# Patient Record
Sex: Male | Born: 1995 | Race: Black or African American | Hispanic: No | Marital: Single | Smoking: Former smoker
Health system: Southern US, Community
[De-identification: ages and names within clinical notes are randomized; demographics above are authoritative.]

## PROBLEM LIST (undated history)

## (undated) DIAGNOSIS — J45909 Unspecified asthma, uncomplicated: Secondary | ICD-10-CM

## (undated) DIAGNOSIS — T7840XA Allergy, unspecified, initial encounter: Secondary | ICD-10-CM

## (undated) DIAGNOSIS — L309 Dermatitis, unspecified: Secondary | ICD-10-CM

## (undated) HISTORY — DX: Allergy, unspecified, initial encounter: T78.40XA

## (undated) HISTORY — DX: Unspecified asthma, uncomplicated: J45.909

---
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
## 2016-02-20 ENCOUNTER — Encounter (HOSPITAL_COMMUNITY): Payer: Self-pay | Admitting: Emergency Medicine

## 2016-02-20 ENCOUNTER — Emergency Department (HOSPITAL_COMMUNITY)
Admission: EM | Admit: 2016-02-20 | Discharge: 2016-02-20 | Disposition: A | Discharge status: Home / Self Care | Payer: Managed Care, Other (non HMO) | Attending: Emergency Medicine | Admitting: Emergency Medicine

## 2016-02-20 DIAGNOSIS — L259 Unspecified contact dermatitis, unspecified cause: Secondary | ICD-10-CM | POA: Diagnosis not present

## 2016-02-20 DIAGNOSIS — L309 Dermatitis, unspecified: Secondary | ICD-10-CM

## 2016-02-20 MED ORDER — CETAPHIL MOISTURIZING EX CREA
TOPICAL_CREAM | Freq: Two times a day (BID) | CUTANEOUS | 0 refills | Status: DC
Start: 2016-02-20 — End: 2023-01-26

## 2016-02-20 MED ORDER — TRIAMCINOLONE ACETONIDE 0.1 % EX CREA: 1.0000 "application " | TOPICAL_CREAM | Freq: Two times a day (BID) | CUTANEOUS | 0 refills | Status: DC

## 2016-02-20 MED ORDER — PREDNISONE 10 MG (21) PO TBPK: 10.0000 mg | ORAL_TABLET | Freq: Every day | ORAL | 0 refills | Status: DC

## 2016-02-20 NOTE — ED Triage Notes (Signed)
Patient c/o eczema flaring up on face and body x 4 days.

## 2016-02-20 NOTE — Discharge Instructions (Signed)
Read the information below.  Use the prescribed medication as directed.  Please discuss all new medications with your pharmacist.  You may return to the Emergency Department at any time for worsening condition or any new symptoms that concern you.     If you develop redness, swelling, pus draining from the wound, or fevers greater than 100.4, return to the ER immediately for a recheck.   °

## 2016-02-20 NOTE — ED Notes (Signed)
Patient was alert, oriented and stable upon discharge. RN went over AVS and patient had no further questions.  

## 2016-02-20 NOTE — ED Provider Notes (Signed)
[facility]-EMERGENCY DEPT Provider Note   CSN: [id number] Arrival date & time: 02/20/16  1735  By signing my name below, I, Soijett Blue, attest that this documentation has been prepared under the direction and in the presence of Trixie DredgeEmily Jacier Gladu, PA-C Electronically Signed: Soijett Blue, ED Scribe. 02/20/16. 7:05 PM.  History   Chief Complaint Chief Complaint  Patient presents with  . Eczema    HPI Randall Price is a 20 y.o. male with a PMHx of eczema, who presents to the Emergency Department complaining of eczema flare onset 4 days ago. Pt states that this current eczema flare is localized to his trunk, face, and BUE. Pt reports that his rash is worsened when he attempts to stretch his chest and he denies alleviating factors for his rash. Pt notes that when he scratches the affected areas, he will have intermittent drainage from the affected areas. Pt states that he has an appointment with a dermatologist on 03/19/2015. He notes that he has not tried any medications for the relief of his symptoms. He denies painful areas, fever, chills, color change, and any other symptoms.     The history is provided by the patient. No language interpreter was used.    Past Medical History:  Diagnosis Date  . Eczema     There are no active problems to display for this patient.   Past Surgical History:  Procedure Laterality Date  . HIP SURGERY Right 2011       Home Medications    Prior to Admission medications   Medication Sig Start Date End Date Taking? Authorizing Provider  Emollient (CETAPHIL) cream Apply topically 2 (two) times daily. 02/20/16   Trixie DredgeEmily Avonne Berkery, PA-C  ibuprofen (ADVIL,MOTRIN) 600 MG tablet Take 1 tablet (600 mg total) by mouth every 6 (six) hours as needed. 10/18/15   Barrett HenleNicole Elizabeth Nadeau, PA-C  predniSONE (STERAPRED UNI-PAK 21 TAB) 10 MG (21) TBPK tablet Take 1 tablet (10 mg total) by mouth daily. Day 1: take 6 tabs.  Day 2: 5 tabs  Day 3: 4 tabs  Day 4: 3 tabs  Day 5: 2 tabs  Day  6: 1 tab 02/20/16   Trixie DredgeEmily Mahoganie Basher, PA-C  triamcinolone cream (KENALOG) 0.1 % Apply 1 application topically 2 (two) times daily. On small areas at a time. 02/20/16   Trixie DredgeEmily Gertrue Willette, PA-C    Family History No family history on file.  Social History Social History  Substance Use Topics  . Smoking status: Never Smoker  . Smokeless tobacco: Never Used  . Alcohol use Yes     Comment: rarely     Allergies   Patient has no known allergies.   Review of Systems Review of Systems  Constitutional: Negative for chills and fever.  Musculoskeletal: Negative for myalgias.  Skin: Positive for rash (eczema flare localized to trunk, face, and BUE). Negative for color change and wound.  Allergic/Immunologic: Negative for immunocompromised state.  Psychiatric/Behavioral: Negative for self-injury.     Physical Exam Updated Vital Signs BP 131/73   Pulse 86   Temp 97.6 F (36.4 C)   Resp 16   SpO2 100%   Physical Exam  Constitutional: He appears well-developed and well-nourished. No distress.  HENT:  Head: Normocephalic and atraumatic.  Neck: Neck supple.  Pulmonary/Chest: Effort normal.  Neurological: He is alert.  Skin: He is not diaphoretic. No erythema.  Diffuse dry thickened skin with scale and lichenification over face, trunk and BUE. No erythema, edema, warmth, discharge, or tenderness.  Nursing note and  vitals reviewed.    ED Treatments / Results  DIAGNOSTIC STUDIES: Oxygen Saturation is 100% on RA, nl by my interpretation.    COORDINATION OF CARE: 6:56 PM Discussed treatment plan with pt at bedside which includes triamcinolone Rx, prednisone Rx and pt agreed to plan.   Procedures Procedures (including critical care time)  Medications Ordered in ED Medications - No data to display   Initial Impression / Assessment and Plan / ED Course  I have reviewed the triage vital signs and the nursing notes.   Clinical Course     Afebrile, nontoxic patient with chronic eczema  with a flare up of his eczema.  No e/o superinfection.  Pt has been out of his triamcinolone cream.   D/C home with prednisone, triamcinolone cream, cetaphil cream, PCP/derm follow up.   Discussed result, findings, treatment, and follow up  with patient.  Pt given return precautions.  Pt verbalizes understanding and agrees with plan.       Final Clinical Impressions(s) / ED Diagnoses   Final diagnoses:  Eczema, unspecified type    New Prescriptions Discharge Medication List as of 02/20/2016  7:04 PM    START taking these medications   Details  Emollient (CETAPHIL) cream Apply topically 2 (two) times daily., Starting Tue 02/20/2016, Print    predniSONE (STERAPRED UNI-PAK 21 TAB) 10 MG (21) TBPK tablet Take 1 tablet (10 mg total) by mouth daily. Day 1: take 6 tabs.  Day 2: 5 tabs  Day 3: 4 tabs  Day 4: 3 tabs  Day 5: 2 tabs  Day 6: 1 tab, Starting Tue 02/20/2016, Print       I personally performed the services described in this documentation, which was scribed in my presence. The recorded information has been reviewed and is accurate.    Trixie Dredgemily Jacobi Ryant, PA-C 02/20/16 1939    Rolan BuccoMelanie Belfi, MD 02/20/16 [phone]

## 2016-04-09 ENCOUNTER — Encounter (HOSPITAL_COMMUNITY): Payer: Self-pay | Admitting: Emergency Medicine

## 2016-04-09 ENCOUNTER — Emergency Department (HOSPITAL_COMMUNITY)
Admission: EM | Admit: 2016-04-09 | Discharge: 2016-04-09 | Disposition: A | Discharge status: Home / Self Care | Payer: Managed Care, Other (non HMO) | Attending: Emergency Medicine | Admitting: Emergency Medicine

## 2016-04-09 DIAGNOSIS — L308 Other specified dermatitis: Secondary | ICD-10-CM

## 2016-04-09 DIAGNOSIS — L309 Dermatitis, unspecified: Secondary | ICD-10-CM | POA: Insufficient documentation

## 2016-04-09 MED ORDER — TRIAMCINOLONE ACETONIDE 0.1 % EX CREA: 1.0000 "application " | TOPICAL_CREAM | Freq: Two times a day (BID) | CUTANEOUS | 0 refills | Status: DC

## 2016-04-09 MED ORDER — PREDNISONE 20 MG PO TABS: ORAL_TABLET | ORAL | 0 refills | Status: DC

## 2016-04-09 NOTE — ED Provider Notes (Signed)
[facility] DEPT Provider Note   CSN: [id number] Arrival date & time: 04/09/16  1231   By signing my name below, I, Soijett Blue, attest that this documentation has been prepared under the direction and in the presence of Sharilyn SitesLisa Cathryn Gallery, PA-C Electronically Signed: Soijett Blue, ED Scribe. 04/09/16. 2:21 PM.  History   Chief Complaint Chief Complaint  Patient presents with  . Rash    HPI Randall Price is a 21 y.o. male with a PMHx of eczema, who presents to the Emergency Department complaining of exacerbation of eczema onset 2 days ago. Pt reports that the affected areas are pruritic. He hasn't tried any medications for the relief of his symptoms. Pt denies fever, chills, drainage, and any other symptoms. Denies allergies to medications. Denies having a PCP.  The history is provided by the patient. No language interpreter was used.    Past Medical History:  Diagnosis Date  . Eczema     There are no active problems to display for this patient.   Past Surgical History:  Procedure Laterality Date  . HIP SURGERY Right 2011       Home Medications    Prior to Admission medications   Medication Sig Start Date End Date Taking? Authorizing Provider  Emollient (CETAPHIL) cream Apply topically 2 (two) times daily. 02/20/16   Trixie DredgeEmily West, PA-C  ibuprofen (ADVIL,MOTRIN) 600 MG tablet Take 1 tablet (600 mg total) by mouth every 6 (six) hours as needed. 10/18/15   Barrett HenleNicole Elizabeth Nadeau, PA-C  predniSONE (STERAPRED UNI-PAK 21 TAB) 10 MG (21) TBPK tablet Take 1 tablet (10 mg total) by mouth daily. Day 1: take 6 tabs.  Day 2: 5 tabs  Day 3: 4 tabs  Day 4: 3 tabs  Day 5: 2 tabs  Day 6: 1 tab 02/20/16   Trixie DredgeEmily West, PA-C  triamcinolone cream (KENALOG) 0.1 % Apply 1 application topically 2 (two) times daily. On small areas at a time. 02/20/16   Trixie DredgeEmily West, PA-C    Family History Family History  Problem Relation Age of Onset  . Hypertension Mother     Social History Social History    Substance Use Topics  . Smoking status: Never Smoker  . Smokeless tobacco: Never Used  . Alcohol use Yes     Comment: rarely     Allergies   Patient has no known allergies.   Review of Systems Review of Systems  Constitutional: Negative for chills and fever.  Skin: Positive for rash.       No drainage  All other systems reviewed and are negative.   Physical Exam Updated Vital Signs BP 117/68 (BP Location: Right Arm)   Pulse 66   Temp 97.6 F (36.4 C) (Oral)   Resp 16   Wt 260 lb (117.9 kg)   SpO2 100%   Physical Exam  Constitutional: He is oriented to person, place, and time. He appears well-developed and well-nourished.  HENT:  Head: Normocephalic and atraumatic.  Mouth/Throat: Oropharynx is clear and moist.  Eyes: Conjunctivae and EOM are normal. Pupils are equal, round, and reactive to light.  Neck: Normal range of motion.  Cardiovascular: Normal rate, regular rhythm and normal heart sounds.   Pulmonary/Chest: Effort normal and breath sounds normal.  Abdominal: Soft. Bowel sounds are normal.  Musculoskeletal: Normal range of motion.  Neurological: He is alert and oriented to person, place, and time.  Skin: Skin is warm and dry. Rash noted.  Eczematous rash to bilateral AC fossa and wrists. There are  signs of excoriation without signs of superimposed infection.   Psychiatric: He has a normal mood and affect.  Nursing note and vitals reviewed.    ED Treatments / Results  DIAGNOSTIC STUDIES: Oxygen Saturation is 100% on RA, nl by my interpretation.    COORDINATION OF CARE: 2:19 PM Discussed treatment plan with pt at bedside which includes kenalog cream, prednisone, referral and follow up with PCP, and pt agreed to plan.  Procedures Procedures (including critical care time)  Medications Ordered in ED Medications - No data to display   Initial Impression / Assessment and Plan / ED Course  I have reviewed the triage vital signs and the nursing  notes.  21 year old male here with likely eczema flare. He reports history of the same, seen here last month for similar. He does have evidence of eczema to the antecubital fossa bilaterally as well as the wrist. There are signs of excoriation but no superimposed infection or cellulitis. His responded well to prednisone and Kenalog therapy in the past so will start this today. He was strongly encouraged to follow-up with his primary care doctor for this issue as it seems to be recurrent.  Discussed plan with patient, he acknowledged understanding and agreed with plan of care.  Return precautions given for new or worsening symptoms.  Final Clinical Impressions(s) / ED Diagnoses   Final diagnoses:  Other eczema    New Prescriptions New Prescriptions   PREDNISONE (DELTASONE) 20 MG TABLET    Take 40 mg by mouth daily for 3 days, then 20mg  by mouth daily for 3 days, then 10mg  daily for 3 days   TRIAMCINOLONE CREAM (KENALOG) 0.1 %    Apply 1 application topically 2 (two) times daily.   I personally performed the services described in this documentation, which was scribed in my presence. The recorded information has been reviewed and is accurate.    Garlon Hatchet, PA-C 04/09/16 1437    Gwyneth Sprout, MD 04/10/16 2136

## 2016-04-09 NOTE — ED Triage Notes (Signed)
Pt reports one week hx of eczema "flair up" C/o itching discomfort

## 2016-04-09 NOTE — Discharge Instructions (Signed)
Take the prescribed medication as directed. °Follow-up with your primary care doctor. °Return to the ED for new or worsening symptoms. °

## 2016-04-30 ENCOUNTER — Encounter: Payer: Self-pay | Admitting: Medical

## 2016-04-30 ENCOUNTER — Ambulatory Visit (INDEPENDENT_AMBULATORY_CARE_PROVIDER_SITE_OTHER): Payer: BLUE CROSS/BLUE SHIELD | Admitting: Medical

## 2016-04-30 VITALS — BP 150/82 | HR 82 | Ht 69.0 in | Wt 268.2 lb

## 2016-04-30 DIAGNOSIS — J4520 Mild intermittent asthma, uncomplicated: Secondary | ICD-10-CM | POA: Diagnosis not present

## 2016-04-30 DIAGNOSIS — L309 Dermatitis, unspecified: Secondary | ICD-10-CM | POA: Diagnosis not present

## 2016-04-30 DIAGNOSIS — Z720 Tobacco use: Secondary | ICD-10-CM

## 2016-04-30 DIAGNOSIS — J301 Allergic rhinitis due to pollen: Secondary | ICD-10-CM

## 2016-04-30 MED ORDER — CETIRIZINE HCL 10 MG PO TABS: 10.0000 mg | ORAL_TABLET | Freq: Every day | ORAL | 2 refills | Status: DC

## 2016-04-30 MED ORDER — MONTELUKAST SODIUM 10 MG PO TABS: 10.0000 mg | ORAL_TABLET | Freq: Every day | ORAL | 2 refills | Status: DC

## 2016-04-30 MED ORDER — TRIAMCINOLONE ACETONIDE 0.1 % EX CREA: 1.0000 "application " | TOPICAL_CREAM | Freq: Two times a day (BID) | CUTANEOUS | 0 refills | Status: DC

## 2016-04-30 MED ORDER — PREDNISONE 10 MG PO TABS: ORAL_TABLET | ORAL | 0 refills | Status: DC

## 2016-04-30 NOTE — Progress Notes (Signed)
Subjective: Chief Complaint  Patient presents with  . new patient    new patient eczema   Here as a new patient.   Was going to urgent care prior.   Has seen [facility] prior.  Been having flare up fo eczema of late, has had problems with eczema since childhood.    He notes in the past tree and other pollens flare it up.  In the past has used prednisone, creams, triamcinolone.  No prior Singulair. Has used oral antihistamines in the past.     Hx/o asthma, but no recent flare ups.  Doesn't have an inhaler.   Smokes occasionally.  Hasn't used inhaler for years.  No hx/o high blood pressure.  Exercise - sometimes weights, sometimes treadmill.  Not currently exercising regularly  Diet - eats twice daily, no particular diet discretion.  Not sure if he has had routine labs.  Works at [employer].    Past Medical History:  Diagnosis Date  . Allergy   . Asthma   . Eczema   . Eczema    Current Outpatient Prescriptions on File Prior to Visit  Medication Sig Dispense Refill  . Emollient (CETAPHIL) cream Apply topically 2 (two) times daily. (Patient not taking: Reported on 04/30/2016) 90 g 0  . ibuprofen (ADVIL,MOTRIN) 600 MG tablet Take 1 tablet (600 mg total) by mouth every 6 (six) hours as needed. (Patient not taking: Reported on 04/30/2016) 30 tablet 0  . predniSONE (DELTASONE) 20 MG tablet Take 40 mg by mouth daily for 3 days, then 20mg  by mouth daily for 3 days, then 10mg  daily for 3 days (Patient not taking: Reported on 04/30/2016) 12 tablet 0  . triamcinolone cream (KENALOG) 0.1 % Apply 1 application topically 2 (two) times daily. (Patient not taking: Reported on 04/30/2016) 30 g 0   No current facility-administered medications on file prior to visit.    ROS as in subjective    Objective: BP (!) 150/82   Pulse 82   Ht 5\' 9"  (1.753 m)   Wt 268 lb 3.2 oz (121.7 kg)   SpO2 99%   BMI 39.61 kg/m   Gen: wd, wn, nad, AA male Diffuse patches of rough skin on bilat arms throughout  including elbows, forearms, wrists, legs thorugh out, torso thorugh out   Assessment: Encounter Diagnoses  Name Primary?  . Eczema, unspecified type Yes  . Mild intermittent asthma, unspecified whether complicated   . Chronic allergic rhinitis due to pollen, unspecified seasonality   . Tobacco use      Plan: discussed his symptoms, exam findings, history.  Begin Singulair and zyrtec QHS daily, use triamcinolone current for flare for up to 2 weeks, then prn, begin short term prednisone.  Discussed healthy diet, water intake, avoiding high sugar and fried foods.   Discussed risks/benefits of medication, proper use of medications.   F/u 4-6 wk for recheck and physical, fasting.    Hershal was seen today for new patient.  Diagnoses and all orders for this visit:  Eczema, unspecified type  Mild intermittent asthma, unspecified whether complicated  Chronic allergic rhinitis due to pollen, unspecified seasonality  Tobacco use  Other orders -     montelukast (SINGULAIR) 10 MG tablet; Take 1 tablet (10 mg total) by mouth at bedtime. -     triamcinolone cream (KENALOG) 0.1 %; Apply 1 application topically 2 (two) times daily. -     cetirizine (ZYRTEC) 10 MG tablet; Take 1 tablet (10 mg total) by mouth at bedtime. -  predniSONE (DELTASONE) 10 MG tablet; 6/5/4/3/2/1 taper

## 2016-06-04 ENCOUNTER — Encounter: Payer: Self-pay | Admitting: Medical

## 2016-06-04 ENCOUNTER — Ambulatory Visit (INDEPENDENT_AMBULATORY_CARE_PROVIDER_SITE_OTHER): Payer: BLUE CROSS/BLUE SHIELD | Admitting: Medical

## 2016-06-04 VITALS — BP 128/74 | HR 90 | Ht 69.0 in | Wt 273.0 lb

## 2016-06-04 DIAGNOSIS — J4520 Mild intermittent asthma, uncomplicated: Secondary | ICD-10-CM

## 2016-06-04 DIAGNOSIS — Z841 Family history of disorders of kidney and ureter: Secondary | ICD-10-CM | POA: Diagnosis not present

## 2016-06-04 DIAGNOSIS — L2084 Intrinsic (allergic) eczema: Secondary | ICD-10-CM | POA: Diagnosis not present

## 2016-06-04 DIAGNOSIS — Z131 Encounter for screening for diabetes mellitus: Secondary | ICD-10-CM | POA: Diagnosis not present

## 2016-06-04 DIAGNOSIS — R21 Rash and other nonspecific skin eruption: Secondary | ICD-10-CM | POA: Diagnosis not present

## 2016-06-04 DIAGNOSIS — J301 Allergic rhinitis due to pollen: Secondary | ICD-10-CM

## 2016-06-04 DIAGNOSIS — Z0000 Encounter for general adult medical examination without abnormal findings: Secondary | ICD-10-CM

## 2016-06-04 DIAGNOSIS — Z113 Encounter for screening for infections with a predominantly sexual mode of transmission: Secondary | ICD-10-CM | POA: Diagnosis not present

## 2016-06-04 LAB — COMPREHENSIVE METABOLIC PANEL
ALBUMIN: 4.1 g/dL (ref 3.6–5.1)
ALT: 17 U/L (ref 8–46)
AST: 19 U/L (ref 12–32)
Alkaline Phosphatase: 79 U/L (ref 48–230)
BILIRUBIN TOTAL: 1.2 mg/dL — AB (ref 0.2–1.1)
BUN: 10 mg/dL (ref 7–20)
CHLORIDE: 99 mmol/L (ref 98–110)
CO2: 25 mmol/L (ref 20–31)
CREATININE: 1.04 mg/dL (ref 0.60–1.26)
Calcium: 9.4 mg/dL (ref 8.9–10.4)
GLUCOSE: 83 mg/dL (ref 65–99)
Potassium: 3.7 mmol/L — ABNORMAL LOW (ref 3.8–5.1)
SODIUM: 136 mmol/L (ref 135–146)
Total Protein: 6.9 g/dL (ref 6.3–8.2)

## 2016-06-04 LAB — CBC
HEMATOCRIT: 44.7 % (ref 38.5–50.0)
HEMOGLOBIN: 14.9 g/dL (ref 13.2–17.1)
MCH: 29.2 pg (ref 27.0–33.0)
MCHC: 33.3 g/dL (ref 32.0–36.0)
MCV: 87.5 fL (ref 80.0–100.0)
MPV: 10.4 fL (ref 7.5–12.5)
PLATELETS: 253 10*3/uL (ref 140–400)
RBC: 5.11 MIL/uL (ref 4.20–5.80)
RDW: 13.7 % (ref 11.0–15.0)
WBC: 9 10*3/uL (ref 4.0–10.5)

## 2016-06-04 LAB — LIPID PANEL
CHOL/HDL RATIO: 5.3 ratio — AB (ref <5.0)
Cholesterol: 143 mg/dL (ref <170)
HDL: 27 mg/dL — AB (ref >45)
LDL CALC: 103 mg/dL (ref <110)
Triglycerides: 67 mg/dL (ref <90)
VLDL: 13 mg/dL (ref <30)

## 2016-06-04 LAB — POCT SKIN KOH

## 2016-06-04 LAB — TSH: TSH: 1.57 m[IU]/L (ref 0.50–4.30)

## 2016-06-04 MED ORDER — PREDNISONE 10 MG PO TABS: ORAL_TABLET | ORAL | 0 refills | Status: DC

## 2016-06-04 MED ORDER — FLUCONAZOLE 100 MG PO TABS
100.0000 mg | ORAL_TABLET | Freq: Every day | ORAL | 0 refills | Status: DC
Start: 2016-06-04 — End: 2023-01-26

## 2016-06-04 NOTE — Progress Notes (Signed)
Subjective:   HPI  Randall Price is a 21 y.o. male who presents for Chief Complaint  Patient presents with  . Annual Exam    physical ,     Medical care team includes: Ernst Breach, PA-C here for primary care Dentist Eye doctor  Concerns: eczema mildly improved, is compliant with Singulair, zyrtec, kenalog cream  Reviewed their medical, surgical, family, social, medication, and allergy history and updated chart as appropriate.  Past Medical History:  Diagnosis Date  . Allergy   . Asthma    mostly childhood  . Eczema     Past Surgical History:  Procedure Laterality Date  . HIP SURGERY Right 2011   prior hip dislocation    Social History   Social History  . Marital status: Single    Spouse name: N/A  . Number of children: 0  . Years of education: N/A   Occupational History  . sorter on line [employer]   Social History Main Topics  . Smoking status: Former Games developer  . Smokeless tobacco: Never Used  . Alcohol use No  . Drug use: Yes    Types: Marijuana  . Sexual activity: Yes    Birth control/ protection: Condom   Other Topics Concern  . Not on file   Social History Narrative  . No narrative on file    Family History  Problem Relation Age of Onset  . Hypertension Mother   . Arthritis Mother     back surgery  . Kidney disease Father     dialysis  . Hypertension Father   . Cancer Neg Hx   . Heart disease Neg Hx   . Stroke Neg Hx   . Diabetes Neg Hx      Current Outpatient Prescriptions:  .  cetirizine (ZYRTEC) 10 MG tablet, Take 1 tablet (10 mg total) by mouth at bedtime., Disp: 30 tablet, Rfl: 2 .  Emollient (CETAPHIL) cream, Apply topically 2 (two) times daily., Disp: 90 g, Rfl: 0 .  montelukast (SINGULAIR) 10 MG tablet, Take 1 tablet (10 mg total) by mouth at bedtime., Disp: 30 tablet, Rfl: 2 .  triamcinolone cream (KENALOG) 0.1 %, Apply 1 application topically 2 (two) times daily., Disp: 30 g, Rfl: 0 .  fluconazole  (DIFLUCAN) 100 MG tablet, Take 1 tablet (100 mg total) by mouth daily., Disp: 10 tablet, Rfl: 0 .  predniSONE (DELTASONE) 10 MG tablet, 6/5/4/3/2/1 taper, Disp: 21 tablet, Rfl: 0  No Known Allergies     Review of Systems Constitutional: -fever, -chills, -sweats, -unexpected weight change, -decreased appetite, -fatigue Allergy: -sneezing, -itching, -congestion Dermatology: -changing moles, +rash, -lumps ENT: -runny nose, -ear pain, -sore throat, -hoarseness, -sinus pain, -teeth pain, - ringing in ears, -hearing loss, -nosebleeds Cardiology: -chest pain, -palpitations, -swelling, -difficulty breathing when lying flat, -waking up short of breath Respiratory: -cough, -shortness of breath, -difficulty breathing with exercise or exertion, -wheezing, -coughing up blood Gastroenterology: -abdominal pain, -nausea, -vomiting, -diarrhea, -constipation, -blood in stool, -changes in bowel movement, -difficulty swallowing or eating Hematology: -bleeding, -bruising  Musculoskeletal: -joint aches, -muscle aches, -joint swelling, -back pain, -neck pain, -cramping, -changes in gait Ophthalmology: denies vision changes, eye redness, itching, discharge Urology: -burning with urination, -difficulty urinating, -blood in urine, -urinary frequency, -urgency, -incontinence Neurology: -headache, -weakness, -tingling, -numbness, -memory loss, -falls, -dizziness Psychology: -depressed mood, -agitation, -sleep problems     Objective:   BP 128/74   Pulse 90   Ht 5\' 9"  (1.753 m)   Wt 273 lb (123.8  kg)   SpO2 99%   BMI 40.32 kg/m   General appearance: alert, no distress, WD/WN, African American male Skin: moderate rough skin throughout arms, legs, abdomen, back, neck, no specific worrisome moles HEENT: normocephalic, conjunctiva/corneas normal, sclerae anicteric, PERRLA, EOMi, nares patent, no discharge or erythema, pharynx normal Oral cavity: MMM, tongue normal, teeth normal Neck: supple, no lymphadenopathy,  no thyromegaly, no masses, normal ROM, no bruits Chest: non tender, normal shape and expansion Heart: RRR, normal S1, S2, no murmurs Lungs: CTA bilaterally, no wheezes, rhonchi, or rales Abdomen: +bs, soft, non tender, non distended, no masses, no hepatomegaly, no splenomegaly, no bruits Back: non tender, normal ROM, no scoliosis Musculoskeletal: upper extremities non tender, no obvious deformity, normal ROM throughout, lower extremities non tender, no obvious deformity, normal ROM throughout Extremities: no edema, no cyanosis, no clubbing Pulses: 2+ symmetric, upper and lower extremities, normal cap refill Neurological: alert, oriented x 3, CN2-12 intact, strength normal upper extremities and lower extremities, sensation normal throughout, DTRs 2+ throughout, no cerebellar signs, gait normal Psychiatric: normal affect, behavior normal, pleasant  GU: normal male external genitalia,circumcised, nontender, no masses, no hernia, no lymphadenopathy Rectal: deferred   Assessment and Plan :    Encounter Diagnoses  Name Primary?  . Encounter for health maintenance examination in adult Yes  . Intrinsic eczema   . Mild intermittent asthma, unspecified whether complicated   . Allergic rhinitis due to pollen, unspecified chronicity, unspecified seasonality   . Screen for STD (sexually transmitted disease)   . Screening for diabetes mellitus   . Family history of kidney disease in father   . Morbid obesity (HCC)   . Rash     Physical exam - discussed and counseled on healthy lifestyle, diet, exercise, preventative care, vaccinations, sick and well care, proper use of emergency dept and after hours care, and addressed their concerns.    Health screening: See your eye doctor yearly for routine vision care. See your dentist yearly for routine dental care including hygiene visits twice yearly. Discussed STD testing, discussed prevention, condom use, means of transmission  Cancer  screening Discussed testicular cancer screening  Vaccinations: Counseled on the following vaccines:  Yearly flu shot and bexsero will need Td in 2 years.   He will call insurance   Acute issues discussed: none  Separate significant chronic issues discussed: Eczema, rash - c/t Singulair, zyrtec and daily moisturizing cream per last visit but add short term diflucan and prednisone.   If not improving in the next several weeks, then plan referral to dermatology  Clifton Custardaron was seen today for annual exam.  Diagnoses and all orders for this visit:  Encounter for health maintenance examination in adult -     Comprehensive metabolic panel -     CBC -     Lipid panel -     TSH -     Hemoglobin A1c -     HIV antibody -     RPR -     GC/Chlamydia Probe Amp  Intrinsic eczema -     POCT Skin KOH  Mild intermittent asthma, unspecified whether complicated  Allergic rhinitis due to pollen, unspecified chronicity, unspecified seasonality  Screen for STD (sexually transmitted disease) -     HIV antibody -     RPR -     GC/Chlamydia Probe Amp  Screening for diabetes mellitus  Family history of kidney disease in father  Morbid obesity (HCC) -     Comprehensive metabolic panel -  Hemoglobin A1c  Rash -     POCT Skin KOH  Other orders -     fluconazole (DIFLUCAN) 100 MG tablet; Take 1 tablet (100 mg total) by mouth daily. -     predniSONE (DELTASONE) 10 MG tablet; 6/5/4/3/2/1 taper    Follow-up pending labs, yearly for physical

## 2016-06-04 NOTE — Patient Instructions (Signed)
Encounter Diagnoses  Name Primary?  . Encounter for health maintenance examination in adult Yes  . Intrinsic eczema   . Mild intermittent asthma, unspecified whether complicated   . Allergic rhinitis due to pollen, unspecified chronicity, unspecified seasonality   . Screen for STD (sexually transmitted disease)   . Screening for diabetes mellitus   . Family history of kidney disease in father   . Morbid obesity (HCC)   . Rash    Recommendations:  Call insurance above coverage for Meningitis/Bexsero vaccine.  This is a 2 shot series done a month apart  Use condoms  Work on getting a regular exercise routine and eat a healthy low fat diet  We will call with lab results  Check your testicles monthly for lumps or bumps   Health Maintenance, Male A healthy lifestyle and preventive care is important for your health and wellness. Ask your health care provider about what schedule of regular examinations is right for you. What should I know about weight and diet?  Eat a Healthy Diet  Eat plenty of vegetables, fruits, whole grains, low-fat dairy products, and lean protein.  Do not eat a lot of foods high in solid fats, added sugars, or salt. Maintain a Healthy Weight  Regular exercise can help you achieve or maintain a healthy weight. You should:  Do at least 150 minutes of exercise each week. The exercise should increase your heart rate and make you sweat (moderate-intensity exercise).  Do strength-training exercises at least twice a week. Watch Your Levels of Cholesterol and Blood Lipids  Have your blood tested for lipids and cholesterol every 5 years starting at 20 years of age. If you are at high risk for heart disease, you should start having your blood tested when you are 20 years old. You may need to have your cholesterol levels checked more often if:  Your lipid or cholesterol levels are high.  You are older than 20 years of age.  You are at high risk for heart  disease. What should I know about cancer screening? Many types of cancers can be detected early and may often be prevented. Lung Cancer  You should be screened every year for lung cancer if:  You are a current smoker who has smoked for at least 30 years.  You are a former smoker who has quit within the past 15 years.  Talk to your health care provider about your screening options, when you should start screening, and how often you should be screened. Colorectal Cancer  Routine colorectal cancer screening usually begins at 20 years of age and should be repeated every 5-10 years until you are 20 years old. You may need to be screened more often if early forms of precancerous polyps or small growths are found. Your health care provider may recommend screening at an earlier age if you have risk factors for colon cancer.  Your health care provider may recommend using home test kits to check for hidden blood in the stool.  A small camera at the end of a tube can be used to examine your colon (sigmoidoscopy or colonoscopy). This checks for the earliest forms of colorectal cancer. Prostate and Testicular Cancer  Depending on your age and overall health, your health care provider may do certain tests to screen for prostate and testicular cancer.  Talk to your health care provider about any symptoms or concerns you have about testicular or prostate cancer. Skin Cancer  Check your skin from head to toe regularly.  Tell your health care provider about any new moles or changes in moles, especially if:  There is a change in a mole's size, shape, or color.  You have a mole that is larger than a pencil eraser.  Always use sunscreen. Apply sunscreen liberally and repeat throughout the day.  Protect yourself by wearing long sleeves, pants, a wide-brimmed hat, and sunglasses when outside. What should I know about heart disease, diabetes, and high blood pressure?  If you are 5118-20 years of age,  have your blood pressure checked every 3-5 years. If you are 20 years of age or older, have your blood pressure checked every year. You should have your blood pressure measured twice-once when you are at a hospital or clinic, and once when you are not at a hospital or clinic. Record the average of the two measurements. To check your blood pressure when you are not at a hospital or clinic, you can use:  An automated blood pressure machine at a pharmacy.  A home blood pressure monitor.  Talk to your health care provider about your target blood pressure.  If you are between 4345-20 years old, ask your health care provider if you should take aspirin to prevent heart disease.  Have regular diabetes screenings by checking your fasting blood sugar level.  If you are at a normal weight and have a low risk for diabetes, have this test once every three years after the age of 20.  If you are overweight and have a high risk for diabetes, consider being tested at a younger age or more often.  A one-time screening for abdominal aortic aneurysm (AAA) by ultrasound is recommended for men aged 65-75 years who are current or former smokers. What should I know about preventing infection? Hepatitis B  If you have a higher risk for hepatitis B, you should be screened for this virus. Talk with your health care provider to find out if you are at risk for hepatitis B infection. Hepatitis C  Blood testing is recommended for:  Everyone born from [birth year] through 1964.  Anyone with known risk factors for hepatitis C. Sexually Transmitted Diseases (STDs)  You should be screened each year for STDs including gonorrhea and chlamydia if:  You are sexually active and are younger than 20 years of age.  You are older than 20 years of age and your health care provider tells you that you are at risk for this type of infection.  Your sexual activity has changed since you were last screened and you are at an increased risk for  chlamydia or gonorrhea. Ask your health care provider if you are at risk.  Talk with your health care provider about whether you are at high risk of being infected with HIV. Your health care provider may recommend a prescription medicine to help prevent HIV infection. What else can I do?  Schedule regular health, dental, and eye exams.  Stay current with your vaccines (immunizations).  Do not use any tobacco products, such as cigarettes, chewing tobacco, and e-cigarettes. If you need help quitting, ask your health care provider.  Limit alcohol intake to no more than 2 drinks per day. One drink equals 12 ounces of beer, 5 ounces of wine, or 1 ounces of hard liquor.  Do not use street drugs.  Do not share needles.  Ask your health care provider for help if you need support or information about quitting drugs.  Tell your health care provider if you often feel depressed.  Tell your health care provider if you have ever been abused or do not feel safe at home. This information is not intended to replace advice given to you by your health care provider. Make sure you discuss any questions you have with your health care provider. Document Released: 08/24/2007 Document Revised: 10/25/2015 Document Reviewed: 11/29/2014 Elsevier Interactive Patient Education  2017 [employer].

## 2016-06-05 LAB — GC/CHLAMYDIA PROBE AMP
CT Probe RNA: NOT DETECTED
GC Probe RNA: NOT DETECTED

## 2016-06-05 LAB — HIV ANTIBODY (ROUTINE TESTING W REFLEX): HIV 1&2 Ab, 4th Generation: NONREACTIVE

## 2016-06-05 LAB — HEMOGLOBIN A1C
Hgb A1c MFr Bld: 4.9 % (ref <5.7)
Mean Plasma Glucose: 94 mg/dL

## 2016-06-05 LAB — RPR

## 2016-06-13 ENCOUNTER — Telehealth: Payer: Self-pay | Admitting: Medical

## 2016-06-13 NOTE — Telephone Encounter (Signed)
Please note this is not my patient. I believe you replied on another patient. I only have Clearnce Sorrel in my clinic.  Thank you.

## 2016-06-13 NOTE — Telephone Encounter (Signed)
Ok

## 2016-06-13 NOTE — Telephone Encounter (Signed)
Pt called and stated that he continues to have issues with eczema. At his last office visit we was advised that if issue continued he would need a referral to dermatology. He is requesting that be done. Please call pt at [phone].

## 2016-06-13 NOTE — Telephone Encounter (Signed)
ok 

## 2016-06-13 NOTE — Telephone Encounter (Signed)
Sending referral to other dermatologist because of his insurance.t

## 2016-06-17 NOTE — Telephone Encounter (Signed)
Sent  Referral to [facility]

## 2016-08-01 ENCOUNTER — Telehealth: Payer: Self-pay | Admitting: Medical

## 2016-08-01 NOTE — Telephone Encounter (Signed)
Called to let him know that he missed his appt. With [facility]. , I told pt call them back to r/s. His appt.

## 2016-08-01 NOTE — Telephone Encounter (Signed)
Pt states he was supposed to be referred to a dermatologist and he may have missed the appointment but he's not sure, and he doesn't know the name or number, please call & let him know

## 2016-08-26 ENCOUNTER — Encounter (HOSPITAL_COMMUNITY): Payer: Self-pay | Admitting: Emergency Medicine

## 2016-08-26 ENCOUNTER — Emergency Department (HOSPITAL_COMMUNITY)
Admission: EM | Admit: 2016-08-26 | Discharge: 2016-08-26 | Disposition: A | Discharge status: Home / Self Care | Payer: Managed Care, Other (non HMO) | Attending: Emergency Medicine | Admitting: Emergency Medicine

## 2016-08-26 DIAGNOSIS — L209 Atopic dermatitis, unspecified: Secondary | ICD-10-CM | POA: Insufficient documentation

## 2016-08-26 DIAGNOSIS — Z87891 Personal history of nicotine dependence: Secondary | ICD-10-CM | POA: Insufficient documentation

## 2016-08-26 DIAGNOSIS — J45909 Unspecified asthma, uncomplicated: Secondary | ICD-10-CM | POA: Insufficient documentation

## 2016-08-26 DIAGNOSIS — Z79899 Other long term (current) drug therapy: Secondary | ICD-10-CM | POA: Insufficient documentation

## 2016-08-26 MED ORDER — DEXAMETHASONE SODIUM PHOSPHATE 10 MG/ML IJ SOLN
10.0000 mg | Freq: Once | INTRAMUSCULAR | Status: AC
  Administered 2016-08-26: 10 mg via INTRAMUSCULAR
  Filled 2016-08-26: qty 1

## 2016-08-26 MED ORDER — HYDROCORTISONE 2.5 % EX LOTN: TOPICAL_LOTION | Freq: Two times a day (BID) | CUTANEOUS | 1 refills | Status: DC | PRN

## 2016-08-26 NOTE — ED Triage Notes (Signed)
Pt states his eczema is bothering him so bad he cannot sleep  Pt is c/o itching and burning

## 2016-08-26 NOTE — ED Provider Notes (Signed)
[facility]-EMERGENCY DEPT Provider Note   CSN: [id number] Arrival date & time: 08/26/16  0050     History   Chief Complaint Chief Complaint  Patient presents with  . Eczema    HPI Randall Price is a 21 y.o. male.  21 year old male with a history of chronic eczema presents to the emergency department for eczema flare. He is requesting steroids. He has not followed up with a dermatologist, though he did call to make an appointment 1 week ago. He neglected follow-through due to inability to be scheduled within the next 30 days. No associated fevers or new contacts.   The history is provided by the patient. No language interpreter was used.  Rash   This is a chronic problem. Episode onset: worsening x a few days. The problem has been gradually worsening. There has been no fever. Affected Location: diffuse to body. The patient is experiencing no pain. Associated symptoms include itching. Treatments tried: moisturizers. The treatment provided no relief.    Past Medical History:  Diagnosis Date  . Allergy   . Asthma    mostly childhood  . Eczema     Patient Active Problem List   Diagnosis Date Noted  . Encounter for health maintenance examination in adult 06/04/2016  . Morbid obesity (HCC) 06/04/2016  . Family history of kidney disease in father 06/04/2016  . Screen for STD (sexually transmitted disease) 06/04/2016  . Rash 06/04/2016  . Tobacco use 04/30/2016  . Allergic rhinitis due to pollen 04/30/2016  . Mild intermittent asthma 04/30/2016  . Eczema 04/30/2016    Past Surgical History:  Procedure Laterality Date  . HIP SURGERY Right 2011   prior hip dislocation       Home Medications    Prior to Admission medications   Medication Sig Start Date End Date Taking? Authorizing Provider  cetirizine (ZYRTEC) 10 MG tablet Take 1 tablet (10 mg total) by mouth at bedtime. 04/30/16   Tysinger, Kermit Baloavid S, PA-C  Emollient (CETAPHIL) cream Apply topically 2 (two) times daily.  02/20/16   Trixie DredgeWest, Emily, PA-C  fluconazole (DIFLUCAN) 100 MG tablet Take 1 tablet (100 mg total) by mouth daily. 06/04/16   Tysinger, Kermit Baloavid S, PA-C  hydrocortisone 2.5 % lotion Apply topically 2 (two) times daily as needed. 08/26/16   Antony MaduraHumes, Eulah Walkup, PA-C  montelukast (SINGULAIR) 10 MG tablet Take 1 tablet (10 mg total) by mouth at bedtime. 04/30/16   Tysinger, Kermit Baloavid S, PA-C  predniSONE (DELTASONE) 10 MG tablet 6/5/4/3/2/1 taper 06/04/16   Tysinger, Kermit Baloavid S, PA-C    Family History Family History  Problem Relation Age of Onset  . Hypertension Mother   . Arthritis Mother        back surgery  . Kidney disease Father        dialysis  . Hypertension Father   . Cancer Neg Hx   . Heart disease Neg Hx   . Stroke Neg Hx   . Diabetes Neg Hx     Social History Social History  Substance Use Topics  . Smoking status: Former Games developermoker  . Smokeless tobacco: Never Used  . Alcohol use No     Allergies   Patient has no known allergies.   Review of Systems Review of Systems  Skin: Positive for itching and rash.   Ten systems reviewed and are negative for acute change, except as noted in the HPI.    Physical Exam Updated Vital Signs BP 126/87 (BP Location: Left Arm)   Pulse 63  Temp 97.7 F (36.5 C) (Oral)   Resp 15   Ht 5\' 10"  (1.778 m)   Wt 117.9 kg (260 lb)   SpO2 96%   BMI 37.31 kg/m   Physical Exam  Constitutional: He is oriented to person, place, and time. He appears well-developed and well-nourished. No distress.  Nontoxic and in NAD  HENT:  Head: Normocephalic and atraumatic.  Eyes: Conjunctivae and EOM are normal. No scleral icterus.  Neck: Normal range of motion.  Cardiovascular: Normal rate, regular rhythm and intact distal pulses.   Pulmonary/Chest: Effort normal. No respiratory distress.  Respirations even and unlabored  Musculoskeletal: Normal range of motion.  Neurological: He is alert and oriented to person, place, and time.  Skin: Skin is warm and dry. Rash  noted. He is not diaphoretic. No erythema. No pallor.  Diffuse eczematous rash to body.  Psychiatric: He has a normal mood and affect. His behavior is normal.  Nursing note and vitals reviewed.    ED Treatments / Results  Labs (all labs ordered are listed, but only abnormal results are displayed) Labs Reviewed - No data to display  EKG  EKG Interpretation None       Radiology No results found.  Procedures Procedures (including critical care time)  Medications Ordered in ED Medications  dexamethasone (DECADRON) injection 10 mg (10 mg Intramuscular Given 08/26/16 0200)     Initial Impression / Assessment and Plan / ED Course  I have reviewed the triage vital signs and the nursing notes.  Pertinent labs & imaging results that were available during my care of the patient were reviewed by me and considered in my medical decision making (see chart for details).     21 year old male presenting for symptoms consistent with uncomplicated atopic dermatitis. Patient given a shot of Decadron in the emergency department. Will discharge with hydrocortisone lotion. Dermatology follow-up recommended. He has been referred to [facility] dermatology by his primary care doctor. Return precautions discussed and provided. Patient discharged in stable condition with no unaddressed concerns.   Final Clinical Impressions(s) / ED Diagnoses   Final diagnoses:  Atopic dermatitis, unspecified type    New Prescriptions New Prescriptions   HYDROCORTISONE 2.5 % LOTION    Apply topically 2 (two) times daily as needed.     Antony Madura, PA-C 08/26/16 0205    Derwood Kaplan, MD 08/26/16 0300

## 2016-10-11 ENCOUNTER — Ambulatory Visit (HOSPITAL_COMMUNITY)
Admission: EM | Admit: 2016-10-11 | Discharge: 2016-10-11 | Disposition: A | Discharge status: Home / Self Care | Payer: Self-pay | Attending: Emergency Medicine | Admitting: Emergency Medicine

## 2016-10-11 ENCOUNTER — Encounter (HOSPITAL_COMMUNITY): Payer: Self-pay | Admitting: Family Medicine

## 2016-10-11 DIAGNOSIS — L309 Dermatitis, unspecified: Secondary | ICD-10-CM

## 2016-10-11 MED ORDER — PREDNISONE 10 MG (21) PO TBPK: ORAL_TABLET | ORAL | 0 refills | Status: DC

## 2016-10-11 MED ORDER — TRIAMCINOLONE ACETONIDE 0.1 % EX CREA
1.0000 | TOPICAL_CREAM | Freq: Two times a day (BID) | CUTANEOUS | 1 refills | Status: DC
Start: 2016-10-11 — End: 2023-01-26

## 2016-10-11 NOTE — ED Triage Notes (Signed)
Pt here for eczema flare up

## 2016-10-11 NOTE — Discharge Instructions (Signed)
Keep your appointment with your dermatologist for evaluation of your eczema.

## 2016-10-11 NOTE — ED Provider Notes (Signed)
  [facility]   [id number] 10/11/16 Arrival Time: 1027  ASSESSMENT & PLAN:  1. Eczema, unspecified type     Meds ordered this encounter  Medications  . triamcinolone cream (KENALOG) 0.1 %    Sig: Apply 1 application topically 2 (two) times daily.    Dispense:  30 g    Refill:  1    Order Specific Question:   Supervising Provider    Answer:   Domenick GongMORTENSON, ASHLEY [[id number]]  . predniSONE (STERAPRED UNI-PAK 21 TAB) 10 MG (21) TBPK tablet    Sig: Take 6 tablets tomorrow, decrease by 1 each day till finished (6,5,4,3,2,1)    Dispense:  21 tablet    Refill:  0    Order Specific Question:   Supervising Provider    Answer:   Domenick GongMORTENSON, ASHLEY [[id number]]    Reviewed expectations re: course of current medical issues. Questions answered. Outlined signs and symptoms indicating need for more acute intervention. Patient verbalized understanding. After Visit Summary given.   SUBJECTIVE:  Randall Price is a 21 y.o. male who presents with complaint of A scaling, itchy rash bilaterally on the forearms extending to the fingers. He has a past history of eczema, this is previously been managed between here, primary care, in the ER, he does have an appointment scheduled on the 25th of this month with dermatology. Has no fever, chills, shortness of breath, trouble swallowing, or any other constitutional symptoms. He is requesting a steroid taper, and triamcinolone cream  ROS: As per HPI, otherwise negative.   OBJECTIVE:  Vitals:   10/11/16 1048  BP: 122/74  Pulse: 61  Resp: 18  Temp: 98.1 F (36.7 C)  SpO2: 99%     General appearance: alert; no distress HEENT: normocephalic; atraumatic; conjunctivae normal; External ears appear normal Neck: supple Lungs: clear to auscultation bilaterally Heart: regular rate and rhythm Abdomen: soft, non-tender; bowel sounds normal; no masses or organomegaly; no guarding or rebound tenderness Back: no CVA tenderness Extremities: no cyanosis or edema;  symmetrical with no gross deformities Skin: warm and dry, notable scaling lesions bilaterally both arms from the elbow extending past the forearm to the hands, no erythema or evidence of infectious process Neurologic: normal symmetric reflexes; normal gait Psychological:  alert and cooperative; normal mood and affect  No Known Allergies  PMHx, SurgHx, SocialHx, Medications, and Allergies were reviewed in the Visit Navigator and updated as appropriate.      Dorena BodoKennard, Jermya Dowding, NP 10/11/16 1100
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
## 2022-09-25 ENCOUNTER — Encounter: Payer: BLUE CROSS/BLUE SHIELD | Admitting: Medical

## 2022-11-12 ENCOUNTER — Other Ambulatory Visit: Payer: Self-pay

## 2022-11-12 ENCOUNTER — Encounter (HOSPITAL_COMMUNITY): Payer: Self-pay | Admitting: Emergency Medicine

## 2022-11-12 ENCOUNTER — Ambulatory Visit (HOSPITAL_COMMUNITY)
Admission: EM | Admit: 2022-11-12 | Discharge: 2022-11-12 | Disposition: A | Discharge status: Home / Self Care | Payer: No Typology Code available for payment source | Attending: Physician Assistant | Admitting: Physician Assistant

## 2022-11-12 DIAGNOSIS — R21 Rash and other nonspecific skin eruption: Secondary | ICD-10-CM

## 2022-11-12 MED ORDER — VALACYCLOVIR HCL 1 G PO TABS: 1000.0000 mg | ORAL_TABLET | Freq: Three times a day (TID) | ORAL | 0 refills | Status: AC

## 2022-11-12 NOTE — ED Provider Notes (Signed)
[facility]    CSN: [id number] Arrival date & time: 11/12/22  4098      History   Chief Complaint Chief Complaint  Patient presents with   Rash    HPI Randall Price is a 27 y.o. male.   Patient here today for evaluation of genital rash that he has noticed for the last week.  He states that there is a burning stinging sensation but no itching.  He denies any fevers or other symptoms.  He has not had any penile discharge.  He denies any known STD exposures.  He has tried using black seed oil without resolution.  He tried [employer] detergent without improvement.  The history is provided by the patient.    Past Medical History:  Diagnosis Date   Allergy    Asthma    mostly childhood   Eczema     Patient Active Problem List   Diagnosis Date Noted   Encounter for health maintenance examination in adult 06/04/2016   Morbid obesity (HCC) 06/04/2016   Family history of kidney disease in father 06/04/2016   Screen for STD (sexually transmitted disease) 06/04/2016   Rash 06/04/2016   Tobacco use 04/30/2016   Allergic rhinitis due to pollen 04/30/2016   Mild intermittent asthma 04/30/2016   Eczema 04/30/2016    Past Surgical History:  Procedure Laterality Date   HIP SURGERY Right 2011   prior hip dislocation       Home Medications    Prior to Admission medications   Medication Sig Start Date End Date Taking? Authorizing Provider  valACYclovir (VALTREX) 1000 MG tablet Take 1 tablet (1,000 mg total) by mouth 3 (three) times daily. 11/12/22  Yes Tomi Bamberger, PA-C  cetirizine (ZYRTEC) 10 MG tablet Take 1 tablet (10 mg total) by mouth at bedtime. Patient not taking: Reported on 11/12/2022 04/30/16   Tysinger, Kermit Balo, PA-C  Emollient (CETAPHIL) cream Apply topically 2 (two) times daily. Patient not taking: Reported on 11/12/2022 02/20/16   Trixie Dredge, PA-C  fluconazole (DIFLUCAN) 100 MG tablet Take 1 tablet (100 mg total) by mouth daily. Patient not  taking: Reported on 11/12/2022 06/04/16   Tysinger, Kermit Balo, PA-C  montelukast (SINGULAIR) 10 MG tablet Take 1 tablet (10 mg total) by mouth at bedtime. Patient not taking: Reported on 11/12/2022 04/30/16   Tysinger, Kermit Balo, PA-C  predniSONE (STERAPRED UNI-PAK 21 TAB) 10 MG (21) TBPK tablet Take 6 tablets tomorrow, decrease by 1 each day till finished (6,5,4,3,2,1) Patient not taking: Reported on 11/12/2022 10/11/16   Dorena Bodo, NP  triamcinolone cream (KENALOG) 0.1 % Apply 1 application topically 2 (two) times daily. Patient not taking: Reported on 11/12/2022 10/11/16   Dorena Bodo, NP    Family History Family History  Problem Relation Age of Onset   Hypertension Mother    Arthritis Mother        back surgery   Kidney disease Father        dialysis   Hypertension Father    Cancer Neg Hx    Heart disease Neg Hx    Stroke Neg Hx    Diabetes Neg Hx     Social History Social History   Tobacco Use   Smoking status: Former   Smokeless tobacco: Never  Vaping Use   Vaping status: Never Used  Substance Use Topics   Alcohol use: Yes   Drug use: Yes    Types: Marijuana     Allergies   Patient has no  known allergies.   Review of Systems Review of Systems  Constitutional:  Negative for chills and fever.  Eyes:  Negative for discharge and redness.  Respiratory:  Negative for shortness of breath.   Genitourinary:  Positive for genital sores. Negative for penile discharge.  Skin:  Positive for rash.  Neurological:  Negative for numbness.     Physical Exam Triage Vital Signs ED Triage Vitals  Encounter Vitals Group     BP      Systolic BP Percentile      Diastolic BP Percentile      Pulse      Resp      Temp      Temp src      SpO2      Weight      Height      Head Circumference      Peak Flow      Pain Score      Pain Loc      Pain Education      Exclude from Growth Chart    No data found.  Updated Vital Signs BP (!) 132/91 (BP Location: Left Arm)  Comment (BP Location): large cuff  Pulse 84   Temp 99.2 F (37.3 C) (Oral)   Resp 18   SpO2 98%     Physical Exam Vitals and nursing note reviewed.  Constitutional:      General: He is not in acute distress.    Appearance: Normal appearance. He is not ill-appearing.  HENT:     Head: Normocephalic and atraumatic.  Eyes:     Conjunctiva/sclera: Conjunctivae normal.  Cardiovascular:     Rate and Rhythm: Normal rate.  Pulmonary:     Effort: Pulmonary effort is normal. No respiratory distress.  Genitourinary:    Comments: [name], RN- Chaperone  Multiple erythematous vesicular appearing lesions noted to genital area Neurological:     Mental Status: He is alert.  Psychiatric:        Mood and Affect: Mood normal.        Behavior: Behavior normal.        Thought Content: Thought content normal.      UC Treatments / Results  Labs (all labs ordered are listed, but only abnormal results are displayed) Labs Reviewed  HSV CULTURE AND TYPING    EKG   Radiology No results found.  Procedures Procedures (including critical care time)  Medications Ordered in UC Medications - No data to display  Initial Impression / Assessment and Plan / UC Course  I have reviewed the triage vital signs and the nursing notes.  Pertinent labs & imaging results that were available during my care of the patient were reviewed by me and considered in my medical decision making (see chart for details).    Valacyclovir prescribed for suspected genital herpes.  HSV culture ordered.  Will await results for further recommendation but encouraged patient to avoid sexual intercourse while awaiting results.  Patient expresses understanding.  Recommended follow-up with any concerns or persistent/worsening symptoms.  Final Clinical Impressions(s) / UC Diagnoses   Final diagnoses:  Rash of genital area   Discharge Instructions   None    ED Prescriptions     Medication Sig Dispense Auth.  Provider   valACYclovir (VALTREX) 1000 MG tablet Take 1 tablet (1,000 mg total) by mouth 3 (three) times daily. 21 tablet Tomi Bamberger, PA-C      PDMP not reviewed this encounter.   Tomi Bamberger, PA-C  11/12/22 1950  

## 2022-11-12 NOTE — ED Triage Notes (Signed)
Reports a rash in genital area for a week.  Reports a stinging sensation.  Patient reports a scabbing to the area and redness.    Has used black seed oil.    Thought related to laundry detergent, switched detergent, but no change in rash

## 2022-11-12 NOTE — ED Notes (Signed)
At bedside for provider exam 

## 2022-11-15 LAB — HSV CULTURE AND TYPING

## 2023-01-26 ENCOUNTER — Emergency Department (HOSPITAL_COMMUNITY): Payer: No Typology Code available for payment source

## 2023-01-26 ENCOUNTER — Encounter (HOSPITAL_COMMUNITY): Payer: Self-pay

## 2023-01-26 ENCOUNTER — Emergency Department (HOSPITAL_COMMUNITY)
Admission: EM | Admit: 2023-01-26 | Discharge: 2023-01-26 | Disposition: A | Discharge status: Home / Self Care | Payer: No Typology Code available for payment source | Attending: Emergency Medicine | Admitting: Emergency Medicine

## 2023-01-26 ENCOUNTER — Other Ambulatory Visit: Payer: Self-pay

## 2023-01-26 DIAGNOSIS — L97512 Non-pressure chronic ulcer of other part of right foot with fat layer exposed: Secondary | ICD-10-CM | POA: Insufficient documentation

## 2023-01-26 DIAGNOSIS — Z4800 Encounter for change or removal of nonsurgical wound dressing: Secondary | ICD-10-CM | POA: Diagnosis present

## 2023-01-26 LAB — BASIC METABOLIC PANEL
Anion gap: 9 (ref 5–15)
BUN: 17 mg/dL (ref 6–20)
CO2: 26 mmol/L (ref 22–32)
Calcium: 10 mg/dL (ref 8.9–10.3)
Chloride: 102 mmol/L (ref 98–111)
Creatinine, Ser: 0.73 mg/dL (ref 0.61–1.24)
GFR, Estimated: >60 mL/min (ref >60)
Glucose, Bld: 98 mg/dL (ref 70–99)
Potassium: 4.4 mmol/L (ref 3.5–5.1)
Sodium: 137 mmol/L (ref 135–145)

## 2023-01-26 LAB — CBC WITH DIFFERENTIAL/PLATELET
Abs Immature Granulocytes: 0.13 10*3/uL — ABNORMAL HIGH (ref 0.00–0.07)
Basophils Absolute: 0.1 10*3/uL (ref 0.0–0.1)
Basophils Relative: 1 %
Eosinophils Absolute: 0.8 10*3/uL — ABNORMAL HIGH (ref 0.0–0.5)
Eosinophils Relative: 8 %
HCT: 46.2 % (ref 39.0–52.0)
Hemoglobin: 15.4 g/dL (ref 13.0–17.0)
Immature Granulocytes: 1 %
Lymphocytes Relative: 20 %
Lymphs Abs: 2.1 10*3/uL (ref 0.7–4.0)
MCH: 29.2 pg (ref 26.0–34.0)
MCHC: 33.3 g/dL (ref 30.0–36.0)
MCV: 87.5 fL (ref 80.0–100.0)
Monocytes Absolute: 0.6 10*3/uL (ref 0.1–1.0)
Monocytes Relative: 6 %
Neutro Abs: 6.7 10*3/uL (ref 1.7–7.7)
Neutrophils Relative %: 64 %
Platelets: 278 10*3/uL (ref 150–400)
RBC: 5.28 MIL/uL (ref 4.22–5.81)
RDW: 14.8 % (ref 11.5–15.5)
WBC: 10.4 10*3/uL (ref 4.0–10.5)
nRBC: 0 % (ref 0.0–0.2)

## 2023-01-26 LAB — CBG MONITORING, ED: Glucose-Capillary: 84 mg/dL (ref 70–99)

## 2023-01-26 MED ORDER — MUPIROCIN 2 % EX OINT: 1.0000 | TOPICAL_OINTMENT | Freq: Two times a day (BID) | CUTANEOUS | 0 refills | Status: AC

## 2023-01-26 MED ORDER — CLOTRIMAZOLE 1 % EX CREA: TOPICAL_CREAM | CUTANEOUS | 0 refills | Status: AC

## 2023-01-26 MED ORDER — GADOBUTROL 1 MMOL/ML IV SOLN
10.0000 mL | Freq: Once | INTRAVENOUS | Status: AC | PRN
  Administered 2023-01-26: 10 mL via INTRAVENOUS

## 2023-01-26 MED ORDER — MELOXICAM 7.5 MG PO TABS: 7.5000 mg | ORAL_TABLET | Freq: Every day | ORAL | 0 refills | Status: DC

## 2023-01-26 NOTE — ED Notes (Signed)
Pt went to get MR FOOT

## 2023-01-26 NOTE — Discharge Instructions (Addendum)
Please read and follow all provided instructions.  Your diagnoses today include:  1. 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 See below for your results today.   Home care instructions:  Follow any educational materials contained in this packet. Keep affected area above the level of your heart when possible. Wash area gently twice a day with warm soapy water. Do not apply alcohol or hydrogen peroxide. Cover the area if it draining or weeping.   Follow-up instructions: Follow-up with your primary care provider or the wound care referral in the next 1 week  Return instructions:  Return to the Emergency Department if you have: Fever Worsening symptoms Worsening pain Worsening swelling Redness of the skin that moves away from the affected area, especially if it streaks away from the affected area  Any other emergent concerns  Your vital signs today were: BP (!) 176/102   Pulse 68   Temp 98.3 F (36.8 C) (Oral)   Resp 18   SpO2 99%  If your blood pressure (BP) was elevated above 135/85 this visit, please have this repeated by your doctor within one month. --------------

## 2023-01-26 NOTE — ED Triage Notes (Addendum)
Pt c/o wound between R 4th and 5th toes x1 month.  Pain score 5/10.  Pt reports using athletes foot cream w/o relief and reports taking a course of Clindamycin (?) w/o relief.   Pt reports he has been cleaning the wound w/ Peroxide.

## 2023-01-26 NOTE — ED Notes (Addendum)
Disregard

## 2023-01-26 NOTE — ED Provider Notes (Signed)
[place] EMERGENCY DEPARTMENT AT [facility] Provider Note   CSN: [id number] Arrival date & time: 01/26/23  1057     History  Chief Complaint  Patient presents with   Wound Check    Randall Price is a 27 y.o. male.  Patient with no significant past medical history presents emergency department for evaluation of right foot wound ongoing over the past 2 months.  Patient reports history of occasional tinea pedis.  Typically will use topical cream with improvement.  However more recently he developed cracking of the skin and an ulcer beneath the fourth and between the fourth and fifth toes.  He has a lot of pain with working and bearing weight.  He also has increased pain at night.  Patient has been seen in an urgent care most recently on 01/06/2023.  He was placed on a 14-day course of clindamycin.  He states that he is continuing to take the antibiotics.  However the ulcer has not cleared up.  He continues to use peroxide on the wound.  Denies history of diabetes.  No fevers, nausea or vomiting.  He has been using antibiotic ointment on the area most recently.  He stopped using the cream for athlete's foot.         Home Medications Prior to Admission medications   Medication Sig Start Date End Date Taking? Authorizing Provider  cetirizine (ZYRTEC) 10 MG tablet Take 1 tablet (10 mg total) by mouth at bedtime. Patient not taking: Reported on 11/12/2022 04/30/16   Tysinger, Kermit Balo, PA-C  Emollient (CETAPHIL) cream Apply topically 2 (two) times daily. Patient not taking: Reported on 11/12/2022 02/20/16   Trixie Dredge, PA-C  fluconazole (DIFLUCAN) 100 MG tablet Take 1 tablet (100 mg total) by mouth daily. Patient not taking: Reported on 11/12/2022 06/04/16   Tysinger, Kermit Balo, PA-C  montelukast (SINGULAIR) 10 MG tablet Take 1 tablet (10 mg total) by mouth at bedtime. Patient not taking: Reported on 11/12/2022 04/30/16   Tysinger, Kermit Balo, PA-C  predniSONE (STERAPRED UNI-PAK 21 TAB) 10  MG (21) TBPK tablet Take 6 tablets tomorrow, decrease by 1 each day till finished (6,5,4,3,2,1) Patient not taking: Reported on 11/12/2022 10/11/16   Dorena Bodo, NP  triamcinolone cream (KENALOG) 0.1 % Apply 1 application topically 2 (two) times daily. Patient not taking: Reported on 11/12/2022 10/11/16   Dorena Bodo, NP  valACYclovir (VALTREX) 1000 MG tablet Take 1 tablet (1,000 mg total) by mouth 3 (three) times daily. 11/12/22   Tomi Bamberger, PA-C      Allergies    Patient has no known allergies.    Review of Systems   Review of Systems  Physical Exam Updated Vital Signs BP (!) 176/102   Pulse 68   Temp 98.3 F (36.8 C) (Oral)   Resp 18   SpO2 99%  Physical Exam Vitals and nursing note reviewed.  Constitutional:      Appearance: He is well-developed.  HENT:     Head: Normocephalic and atraumatic.  Eyes:     Conjunctiva/sclera: Conjunctivae normal.  Cardiovascular:     Pulses: Normal pulses. No decreased pulses.  Musculoskeletal:        General: Tenderness present.     Cervical back: Normal range of motion and neck supple.     Right lower leg: No edema.     Left lower leg: No edema.     Comments: Right foot: There is an ulceration at the base of the fourth toe and between  the fourth and fifth toes.  This is filled with granulation tissue.  No active drainage.  No surrounding erythema or warmth.  It does not look to be clinically infected.  Skin:    General: Skin is warm and dry.  Neurological:     Mental Status: He is alert.     Sensory: No sensory deficit.     Comments: Motor, sensation, and vascular distal to the injury is fully intact.   Psychiatric:        Mood and Affect: Mood normal.     ED Results / Procedures / Treatments   Labs (all labs ordered are listed, but only abnormal results are displayed) Labs Reviewed - No data to display  EKG None  Radiology No results found.  Procedures Procedures    Medications Ordered in ED Medications -  No data to display  ED Course/ Medical Decision Making/ A&P    Patient seen and examined. History obtained directly from patient. Work-up including labs, imaging, EKG ordered in triage, if performed, were reviewed.    Labs/EKG: Not ordered in triage.  I ordered CBG which was normal.  Given need for imaging, I have ordered CBC and BMP.  Imaging: This was ordered on arrival by triage.  There is a question of osteomyelitis.  MRI of the foot ordered.  Medications/Fluids: None ordered.  Most recent vital signs reviewed and are as follows: BP (!) 176/102   Pulse 68   Temp 98.3 F (36.8 C) (Oral)   Resp 18   SpO2 99%   Initial impression: Patient with wound of the plantar foot, likely initiated by tinea pedis infection and possible interval secondary bacterial infection that appears to be well treated.  Skin appears to be healing at the current time.  As patient has potential for osteo noted on plain films, MRI ordered today as this would require more aggressive follow-up.  Discussed with patient and he agrees to proceed.  3:36 PM Reassessment performed. Patient appears stable.  Labs personally reviewed and interpreted including: CBC with high normal white blood cell count of 10.4 otherwise unremarkable; BMP unremarkable.  Imaging personally visualized and interpreted including: I visualized MRI and read, edema noted, no compelling evidence of osteomyelitis.  Reviewed pertinent lab work and imaging with patient at bedside. Questions answered.   Most current vital signs reviewed and are as follows: BP (!) 142/103   Pulse 70   Temp 98.3 F (36.8 C) (Oral)   Resp 18   SpO2 100%   Plan: Discharge to home.   Prescriptions written for: For direct wound treatment, clotrimazole and mupirocin prescribed.  Also given meloxicam for pain.  Other home care instructions discussed: Do not clean with peroxide  ED return instructions discussed: Pt urged to return with worsening pain, worsening  swelling, expanding area of redness or streaking up extremity, fever, or any other concerns. Pt verbalizes understanding and agrees with plan.  Follow-up instructions discussed: Patient encouraged to follow-up with their PCP and/or wound care in 7 days.                                   Medical Decision Making Amount and/or Complexity of Data Reviewed Labs: ordered. Radiology: ordered.  Risk Prescription drug management.   Patient with foot pain.  He has an open wound on the bottom of his foot as described above.  No fluctuance or purulence.  He has been on antibiotics  and I do not suspect active infection.  There was concern of osteomyelitis on plain films.  This was followed with MRI as presence of osteomyelitis would indicate the need for admission versus more aggressive outpatient treatment.  Fortunately, no signs of osteomyelitis on MRI.  Patient will continue good wound care.  Given duration of wound, encouraged follow-up with wound care clinic and PCP.  Will continue antifungal treatment and topical antibiotics.  The patient's vital signs, pertinent lab work and imaging were reviewed and interpreted as discussed in the ED course. Hospitalization was considered for further testing, treatments, or serial exams/observation. However as patient is well-appearing, has a stable exam, and reassuring studies today, I do not feel that they warrant admission at this time. This plan was discussed with the patient who verbalizes agreement and comfort with this plan and seems reliable and able to return to the Emergency Department with worsening or changing symptoms.          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

## 2023-02-05 ENCOUNTER — Ambulatory Visit: Payer: Self-pay | Admitting: Medical

## 2023-02-05 ENCOUNTER — Encounter: Payer: Self-pay | Admitting: Medical

## 2023-02-05 VITALS — BP 120/68 | HR 72 | Temp 97.1°F | Wt 256.0 lb

## 2023-02-05 DIAGNOSIS — L97518 Non-pressure chronic ulcer of other part of right foot with other specified severity: Secondary | ICD-10-CM | POA: Diagnosis not present

## 2023-02-05 MED ORDER — FLUCONAZOLE 100 MG PO TABS: 100.0000 mg | ORAL_TABLET | Freq: Every day | ORAL | 0 refills | Status: AC

## 2023-02-05 NOTE — Progress Notes (Signed)
Subjective:  Randall Price is a 27 y.o. male who presents for Chief Complaint  Patient presents with   Foot Ulcer    Foot ulcer on right foot between 4th and pinky toe. Been there for over a month.      Here for a foot ulcer.  He has been dealing with a foot ulcer for about a month and a half.  He has a history of foot fungus, athletes feet.  He was using foot fungus medicine but the wound just got bigger and painful.  He has been out of work for leave of absence trying to let this thing heal.  Micah Flesher to urgent care and emergency department recently for the same.  he just recently from 01/26/2023 was prescribed clindamycin oral and continued on topical antibiotic ointment and topical antifungal cream  So far not a whole lot of improvement still  He has an appointment to see wound clinic on December 18 in [place]  He is not a diabetic.  Just had recent blood work that did not show diabetes  He does smoke marijuana but not cigarettes.  He drinks some alcohol.  No other aggravating or relieving factors.    No other c/o.  Past Medical History:  Diagnosis Date   Allergy    Asthma    mostly childhood   Eczema    Current Outpatient Medications on File Prior to Visit  Medication Sig Dispense Refill   clotrimazole (LOTRIMIN) 1 % cream Apply to affected area 2 times daily 15 g 0   mupirocin ointment (BACTROBAN) 2 % Apply 1 Application topically 2 (two) times daily. 22 g 0   valACYclovir (VALTREX) 1000 MG tablet Take 1 tablet (1,000 mg total) by mouth 3 (three) times daily. (Patient not taking: Reported on 02/05/2023) 21 tablet 0   No current facility-administered medications on file prior to visit.   Family History  Problem Relation Age of Onset   Hypertension Mother    Arthritis Mother        back surgery   Kidney disease Father        dialysis   Hypertension Father    Cancer Neg Hx    Heart disease Neg Hx    Stroke Neg Hx    Diabetes Neg Hx      The following portions of  the patient's history were reviewed and updated as appropriate: allergies, current medications, past family history, past medical history, past social history, past surgical history and problem list.  ROS Otherwise as in subjective above    Objective: BP 120/68   Pulse 72   Temp (!) 97.1 F (36.2 C)   Wt 256 lb (116.1 kg)   BMI 36.73 kg/m   General appearance: alert, no distress, well developed, well nourished Right foot with ulceration between fourth and fifth toe with crusting and granulation tissue in between.  Somewhat hypertrophic toenails throughout both feet.  Odor to the ulceration. He seems to have 1+ pedal pulses on the right compared to a little bit better pulses on the left Cap refill may be slightly delayed in general to feet    Assessment: Encounter Diagnosis  Name Primary?   Ulcer of right foot with other severity (HCC) Yes     Plan: Begin trial of oral Diflucan.  Continue topical cream and topical ointment at different times a day that he is already using  He just completed clindamycin oral antibiotic  I reviewed his MRI of the foot from 01/26/2023  Continue  plan to follow-up with wound clinic on December 18.  Referral today to podiatry.  Consider ABIs  I advise he limit alcohol  Note given for work.  I also prescribed a postop shoe to use in the meantime  If any worsening in the next 1 to 2 weeks and call or get reevaluated sooner  Kein was seen today for foot ulcer.  Diagnoses and all orders for this visit:  Ulcer of right foot with other severity (HCC) -     Ambulatory referral to Podiatry  Other orders -     fluconazole (DIFLUCAN) 100 MG tablet; Take 1 tablet (100 mg total) by mouth daily.    Follow up: Pending referral

## 2023-02-26 ENCOUNTER — Ambulatory Visit: Payer: No Typology Code available for payment source | Admitting: Physician Assistant
